# Patient Record
Sex: Male | Born: 1965 | Race: White | Hispanic: No | Marital: Married | Smoking: Current every day smoker
Health system: Southern US, Community
[De-identification: ages and names within clinical notes are randomized; demographics above are authoritative.]

## PROBLEM LIST (undated history)

## (undated) DIAGNOSIS — I2510 Atherosclerotic heart disease of native coronary artery without angina pectoris: Secondary | ICD-10-CM

## (undated) DIAGNOSIS — IMO0002 Reserved for concepts with insufficient information to code with codable children: Secondary | ICD-10-CM

## (undated) DIAGNOSIS — E785 Hyperlipidemia, unspecified: Secondary | ICD-10-CM

## (undated) DIAGNOSIS — I499 Cardiac arrhythmia, unspecified: Secondary | ICD-10-CM

## (undated) DIAGNOSIS — R9430 Abnormal result of cardiovascular function study, unspecified: Secondary | ICD-10-CM

## (undated) DIAGNOSIS — Z720 Tobacco use: Secondary | ICD-10-CM

## (undated) HISTORY — DX: Reserved for concepts with insufficient information to code with codable children: IMO0002

## (undated) HISTORY — DX: Abnormal result of cardiovascular function study, unspecified: R94.30

## (undated) HISTORY — PX: CLAVICLE SURGERY: SHX598

## (undated) HISTORY — DX: Tobacco use: Z72.0

## (undated) HISTORY — PX: FINGER SURGERY: SHX640

## (undated) HISTORY — DX: Cardiac arrhythmia, unspecified: I49.9

---

## 2011-11-24 ENCOUNTER — Encounter (HOSPITAL_COMMUNITY): Payer: Self-pay | Admitting: *Deleted

## 2011-11-24 ENCOUNTER — Other Ambulatory Visit: Payer: Self-pay

## 2011-11-24 ENCOUNTER — Inpatient Hospital Stay (HOSPITAL_COMMUNITY)
Admission: EM | Admit: 2011-11-24 | Discharge: 2011-11-27 | DRG: 853 | Disposition: A | Discharge status: Home / Self Care | Payer: BC Managed Care – PPO | Source: Ambulatory Visit | Attending: Cardiovascular Disease | Admitting: Cardiovascular Disease

## 2011-11-24 ENCOUNTER — Emergency Department (HOSPITAL_COMMUNITY): Payer: BC Managed Care – PPO

## 2011-11-24 DIAGNOSIS — I214 Non-ST elevation (NSTEMI) myocardial infarction: Principal | ICD-10-CM | POA: Diagnosis present

## 2011-11-24 DIAGNOSIS — E785 Hyperlipidemia, unspecified: Secondary | ICD-10-CM | POA: Diagnosis present

## 2011-11-24 DIAGNOSIS — I498 Other specified cardiac arrhythmias: Secondary | ICD-10-CM | POA: Diagnosis present

## 2011-11-24 DIAGNOSIS — I2510 Atherosclerotic heart disease of native coronary artery without angina pectoris: Secondary | ICD-10-CM | POA: Diagnosis present

## 2011-11-24 DIAGNOSIS — F17200 Nicotine dependence, unspecified, uncomplicated: Secondary | ICD-10-CM | POA: Diagnosis present

## 2011-11-24 DIAGNOSIS — R079 Chest pain, unspecified: Secondary | ICD-10-CM

## 2011-11-24 HISTORY — DX: Hyperlipidemia, unspecified: E78.5

## 2011-11-24 HISTORY — DX: Atherosclerotic heart disease of native coronary artery without angina pectoris: I25.10

## 2011-11-24 LAB — CREATININE, SERUM: GFR calc non Af Amer: >90 mL/min (ref >90)

## 2011-11-24 LAB — BASIC METABOLIC PANEL
GFR calc non Af Amer: 56 mL/min — ABNORMAL LOW (ref >90)
Glucose, Bld: 106 mg/dL — ABNORMAL HIGH (ref 70–99)
Potassium: 4 mEq/L (ref 3.5–5.1)
Sodium: 138 mEq/L (ref 135–145)

## 2011-11-24 LAB — CBC
Hemoglobin: 13.9 g/dL (ref 13.0–17.0)
MCH: 31.9 pg (ref 26.0–34.0)
MCHC: 33.8 g/dL (ref 30.0–36.0)
MCHC: 33.8 g/dL (ref 30.0–36.0)
MCV: 94.5 fL (ref 78.0–100.0)
RBC: 4.33 MIL/uL (ref 4.22–5.81)
RBC: 4.2 MIL/uL — ABNORMAL LOW (ref 4.22–5.81)
RDW: 13.5 % (ref 11.5–15.5)
WBC: 10.1 10*3/uL (ref 4.0–10.5)

## 2011-11-24 LAB — CARDIAC PANEL(CRET KIN+CKTOT+MB+TROPI)
CK, MB: 10.9 ng/mL (ref 0.3–4.0)
CK, MB: 81.4 ng/mL (ref 0.3–4.0)
CK, MB: 109.7 ng/mL (ref 0.3–4.0)
Troponin I: 0.67 ng/mL (ref <0.30)
Troponin I: 15.37 ng/mL (ref <0.30)

## 2011-11-24 LAB — DIFFERENTIAL
Basophils Absolute: 0 10*3/uL (ref 0.0–0.1)
Basophils Relative: 0 % (ref 0–1)
Eosinophils Absolute: 0.2 10*3/uL (ref 0.0–0.7)
Eosinophils Relative: 2 % (ref 0–5)
Monocytes Absolute: 0.6 10*3/uL (ref 0.1–1.0)
Monocytes Relative: 6 % (ref 3–12)

## 2011-11-24 LAB — D-DIMER, QUANTITATIVE: D-Dimer, Quant: <0.22 ug/mL-FEU (ref 0.00–0.48)

## 2011-11-24 MED ORDER — ALPRAZOLAM 0.25 MG PO TABS
0.2500 mg | ORAL_TABLET | Freq: Two times a day (BID) | ORAL | Status: DC | PRN
  Administered 2011-11-24: 0.25 mg via ORAL
  Filled 2011-11-24: qty 1

## 2011-11-24 MED ORDER — ALBUTEROL SULFATE HFA 108 (90 BASE) MCG/ACT IN AERS
2.0000 | INHALATION_SPRAY | Freq: Four times a day (QID) | RESPIRATORY_TRACT | Status: DC
  Administered 2011-11-24: 2 via RESPIRATORY_TRACT
  Filled 2011-11-24: qty 6.7

## 2011-11-24 MED ORDER — TICAGRELOR 90 MG PO TABS
90.0000 mg | ORAL_TABLET | Freq: Two times a day (BID) | ORAL | Status: DC
  Administered 2011-11-24 – 2011-11-27 (×6): 90 mg via ORAL
  Filled 2011-11-24 (×8): qty 1

## 2011-11-24 MED ORDER — HEPARIN (PORCINE) IN NACL 100-0.45 UNIT/ML-% IJ SOLN
1000.0000 [IU]/h | INTRAMUSCULAR | Status: DC
  Administered 2011-11-25 – 2011-11-26 (×2): 1000 [IU]/h via INTRAVENOUS
  Filled 2011-11-24 (×6): qty 250

## 2011-11-24 MED ORDER — ACETAMINOPHEN 325 MG PO TABS: 650.0000 mg | ORAL_TABLET | ORAL | Status: DC | PRN

## 2011-11-24 MED ORDER — ONDANSETRON HCL 4 MG/2ML IJ SOLN: 4.0000 mg | Freq: Four times a day (QID) | INTRAMUSCULAR | Status: DC | PRN

## 2011-11-24 MED ORDER — ASPIRIN EC 81 MG PO TBEC
81.0000 mg | DELAYED_RELEASE_TABLET | Freq: Every day | ORAL | Status: DC
  Administered 2011-11-25 – 2011-11-27 (×2): 81 mg via ORAL
  Filled 2011-11-24 (×3): qty 1

## 2011-11-24 MED ORDER — ONDANSETRON HCL 4 MG/2ML IJ SOLN
4.0000 mg | Freq: Once | INTRAMUSCULAR | Status: AC
  Administered 2011-11-24: 4 mg via INTRAVENOUS
  Filled 2011-11-24: qty 2

## 2011-11-24 MED ORDER — METOPROLOL TARTRATE 25 MG PO TABS
25.0000 mg | ORAL_TABLET | Freq: Two times a day (BID) | ORAL | Status: DC
  Administered 2011-11-24 – 2011-11-27 (×6): 25 mg via ORAL
  Filled 2011-11-24 (×8): qty 1

## 2011-11-24 MED ORDER — ROSUVASTATIN CALCIUM 20 MG PO TABS
20.0000 mg | ORAL_TABLET | Freq: Every day | ORAL | Status: DC
  Administered 2011-11-24 – 2011-11-27 (×3): 20 mg via ORAL
  Filled 2011-11-24 (×4): qty 1

## 2011-11-24 MED ORDER — NICOTINE 21 MG/24HR TD PT24
21.0000 mg | MEDICATED_PATCH | Freq: Every day | TRANSDERMAL | Status: DC
  Administered 2011-11-24 – 2011-11-25 (×2): 21 mg via TRANSDERMAL
  Filled 2011-11-24 (×3): qty 1

## 2011-11-24 MED ORDER — HEPARIN BOLUS VIA INFUSION
4000.0000 [IU] | Freq: Once | INTRAVENOUS | Status: AC
  Administered 2011-11-24: 4000 [IU] via INTRAVENOUS
  Filled 2011-11-24: qty 4000

## 2011-11-24 MED ORDER — MORPHINE SULFATE 4 MG/ML IJ SOLN
4.0000 mg | Freq: Once | INTRAMUSCULAR | Status: AC
  Administered 2011-11-24: 4 mg via INTRAVENOUS
  Filled 2011-11-24: qty 1

## 2011-11-24 MED ORDER — NITROGLYCERIN 0.4 MG SL SUBL: 0.4000 mg | SUBLINGUAL_TABLET | SUBLINGUAL | Status: DC | PRN

## 2011-11-24 MED ORDER — HEPARIN SOD (PORCINE) IN D5W 100 UNIT/ML IV SOLN
INTRAVENOUS | Status: AC
  Administered 2011-11-24: 25000 [IU]
  Filled 2011-11-24: qty 250

## 2011-11-24 NOTE — Progress Notes (Signed)
Pt. Had a 10 BRVT. Pt. Was found to be resting and completely asymptomatic. Dr. Elease Hashimoto notified and new orders placed please refer to [facility] and Order Management. Will continue to monitor closely.

## 2011-11-24 NOTE — ED Notes (Addendum)
CRITICAL VALUE ALERT  Critical value received: CK, MB 10.9.  Troponin 0.67  Date of notification:  11/24/2011  Time of notification: 0620  Critical value read back:Yes.  Nurse who received alert:JJohnsonRN  Dr. Dierdre Highman notified.

## 2011-11-24 NOTE — ED Notes (Signed)
Pt reports relief from chest pain.  Resting quietly, respirations regular and unlabored.  Family at bedside.  Pt denies needs at present time.

## 2011-11-24 NOTE — ED Notes (Signed)
Patient is comfortable at this time. Patient states his pain is 0/10 at this moment.

## 2011-11-24 NOTE — ED Notes (Signed)
Pt c/o chest pain that started around 1am tonight. Pt c/o tingling to both arms. Pt had 3 baby aspirin and nitro spray via ems.

## 2011-11-24 NOTE — ED Provider Notes (Signed)
History     CSN: [id number]  Arrival date & time 11/24/11  0454   First MD Initiated Contact with Patient 11/24/11 0503      Chief Complaint  Patient presents with  . Chest Pain    (Consider location/radiation/quality/duration/timing/severity/associated sxs/prior treatment) Patient is a 46 y.o. male presenting with chest pain. The history is provided by the patient.  Chest Pain The chest pain began 3 - 5 hours ago. Duration of episode(s) is 4 hours. Chest pain occurs constantly. The chest pain is improving. Associated with: nothing. At its most intense, the pain is at 6/10. The pain is currently at 1/10. The severity of the pain is moderate. The quality of the pain is described as pressure-like. The pain radiates to the upper back, left jaw, left neck, right arm, left arm, right jaw and right neck. Exacerbated by: nothing. Primary symptoms include shortness of breath. Pertinent negatives for primary symptoms include no fever, no syncope, no cough, no wheezing, no palpitations, no abdominal pain, no nausea, no vomiting, no dizziness and no altered mental status.  Pertinent negatives for associated symptoms include no diaphoresis. Risk factors include male gender and smoking/tobacco exposure.  His past medical history is significant for hypertension.  Pertinent negatives for past medical history include no CAD.  His family medical history is significant for CAD in family.  Procedure history is negative for exercise treadmill test.   pain developed around 1 AM after getting off work. Patient laid down the symptoms persisted. He told his wife about symptoms she called 911. He was given 4 baby aspirin in route and one nitroglycerin and improved his pain from a 6-1. He states he was previously treated for hypertension grandmother medications and never got it refilled. No history of MI or known heart disease but does have a strong family history of same. No history of similar symptoms in the  past.  History reviewed. No pertinent past medical history.  Past Surgical History  Procedure Date  . Clavicle surgery   . Finger surgery     History reviewed. No pertinent family history.  History  Substance Use Topics  . Smoking status: Current Everyday Smoker  . Smokeless tobacco: Not on file  . Alcohol Use: No      Review of Systems  Constitutional: Negative for fever, chills and diaphoresis.  HENT: Negative for neck pain and neck stiffness.   Eyes: Negative for pain.  Respiratory: Positive for shortness of breath. Negative for cough and wheezing.   Cardiovascular: Positive for chest pain. Negative for palpitations and syncope.  Gastrointestinal: Negative for nausea, vomiting and abdominal pain.  Genitourinary: Negative for dysuria.  Musculoskeletal: Negative for back pain.  Skin: Negative for rash.  Neurological: Negative for dizziness and headaches.  Psychiatric/Behavioral: Negative for altered mental status.  All other systems reviewed and are negative.    Allergies  Review of patient's allergies indicates no known allergies.  Home Medications  No current outpatient prescriptions on file.  BP 140/79  Pulse 87  Temp 98 F (36.7 C)  Resp 20  Ht 6' (1.829 m)  Wt 240 lb (108.863 kg)  BMI 32.55 kg/m2  SpO2 97%  Physical Exam  Constitutional: He is oriented to person, place, and time. He appears well-developed and well-nourished.  HENT:  Head: Normocephalic and atraumatic.  Eyes: Conjunctivae and EOM are normal. Pupils are equal, round, and reactive to light.  Neck: Trachea normal. Neck supple. No thyromegaly present.  Cardiovascular: Normal rate, regular rhythm, S1 normal, S2  normal and normal pulses.     No systolic murmur is present   No diastolic murmur is present  Pulses:      Radial pulses are 2+ on the right side, and 2+ on the left side.  Pulmonary/Chest: Effort normal and breath sounds normal. He has no wheezes. He has no rhonchi. He has no  rales. He exhibits no tenderness.  Abdominal: Soft. Normal appearance and bowel sounds are normal. There is no tenderness. There is no CVA tenderness and negative Murphy's sign.  Musculoskeletal:       BLE:s Calves nontender, no cords or erythema, negative Homans sign  Neurological: He is alert and oriented to person, place, and time. He has normal strength. No cranial nerve deficit or sensory deficit. GCS eye subscore is 4. GCS verbal subscore is 5. GCS motor subscore is 6.  Skin: Skin is warm and dry. No rash noted. He is not diaphoretic.  Psychiatric: His speech is normal.       Cooperative and appropriate    ED Course  Procedures (including critical care time)  Results for orders placed during the hospital encounter of 11/24/11  D-DIMER, QUANTITATIVE      Component Value Range   D-Dimer, Quant <0.22  0.00 - 0.48 (ug/mL-FEU)  CBC      Component Value Range   WBC 8.2  4.0 - 10.5 (K/uL)   RBC 4.33  4.22 - 5.81 (MIL/uL)   Hemoglobin 13.9  13.0 - 17.0 (g/dL)   HCT 16.1  09.6 - 04.5 (%)   MCV 94.9  78.0 - 100.0 (fL)   MCH 32.1  26.0 - 34.0 (pg)   MCHC 33.8  30.0 - 36.0 (g/dL)   RDW 40.9  81.1 - 91.4 (%)   Platelets 226  150 - 400 (K/uL)  BASIC METABOLIC PANEL      Component Value Range   Sodium 138  135 - 145 (mEq/L)   Potassium 4.0  3.5 - 5.1 (mEq/L)   Chloride 102  96 - 112 (mEq/L)   CO2 31  19 - 32 (mEq/L)   Glucose, Bld 106 (*) 70 - 99 (mg/dL)   BUN 18  6 - 23 (mg/dL)   Creatinine, Ser 7.82 (*) 0.50 - 1.35 (mg/dL)   Calcium 9.8  8.4 - 95.6 (mg/dL)   GFR calc non Af Amer 56 (*) >90 (mL/min)   GFR calc Af Amer 64 (*) >90 (mL/min)  CARDIAC PANEL(CRET KIN+CKTOT+MB+TROPI)      Component Value Range   Total CK 176  7 - 232 (U/L)   CK, MB 10.9 (*) 0.3 - 4.0 (ng/mL)   Troponin I 0.67 (*) <0.30 (ng/mL)   Relative Index 6.2 (*) 0.0 - 2.5    Dg Chest Portable 1 View  11/24/2011  *RADIOLOGY REPORT*  Clinical Data: Chest pain.  PORTABLE CHEST - 1 VIEW  Comparison: 10/03/2011   Findings: Heart and mediastinal contours are within normal limits. No focal opacities or effusions.  No acute bony abnormality.  IMPRESSION: No active cardiopulmonary disease.  Original Report Authenticated By: Cyndie Chime, M.D.   CRITICAL CARE Performed by: Sunnie Nielsen   Total critical care time: 35  Critical care time was exclusive of separately billable procedures and treating other patients.  Critical care was necessary to treat or prevent imminent or life-threatening deterioration.  Critical care was time spent personally by me on the following activities: development of treatment plan with patient and/or surrogate as well as nursing, discussions with consultants, evaluation of patient's response  to treatment, examination of patient, obtaining history from patient or surrogate, ordering and performing treatments and interventions, ordering and review of laboratory studies, ordering and review of radiographic studies, pulse oximetry and re-evaluation of patient's condition.IV heparin drip for non-ST elevation MI. Serial evaluations remains pain-free after IV morphine. Aspirin provided prior to arrival.   Date: 11/24/2011  Rate:   Rhythm: normal sinus rhythm  QRS Axis: normal  Intervals: normal  ST/T Wave abnormalities: nonspecific ST/T changes  Conduction Disutrbances:none  Narrative Interpretation: inferior and lateral T-wave inversions  Old EKG Reviewed: none available   6:45 AM case discussed as above with Dr. Margo Aye, on call for cardiology unassigned. He accepts patient in transfer to be admitted to [facility] for NSTEMI  MDM   Chest pain concerning for ACS. Aspirin prior to arrival. IV morphine for pain. Patient placed on cardiac monitor. Portal chest x-ray obtained. Labs sent. pain resolved with morphine. Labs reviewed in for elevated troponin, cardiologist consulted. Plan transferred to [facility] for admit step down. Heparin initiated. Patient remains  pain-free.        Sunnie Nielsen, MD 11/24/11 [id number]

## 2011-11-24 NOTE — ED Notes (Signed)
Family at bedside. Patient wanted something to drink. RN Janette approved a cup of water at this time. Patient does not need anything else at this time.

## 2011-11-24 NOTE — Progress Notes (Signed)
CRITICAL VALUE ALERT  Critical value received:  CKMB 81.4 and Troponin 6.77   Date of notification:  11/24/2011  Time of notification:  1420  Critical value read back:yes  Nurse who received alert:  Malachy Chamber, RN   MD notified (1st page):  [place] Lodge, [place]  Time of first page:  1421  MD notified (2nd page):  Time of second page:  Responding MD:  [place]andria Lodge, [place]  Time MD responded:  [phone]

## 2011-11-24 NOTE — ED Notes (Signed)
Pt reprots pain in chest began about 1 am. Reports pain in upper portion of chest and into back.  Denies nausea, vomiting or diaphoresis.  Pt did receive 3 doses of nitro and 324 mg ASA via EMS prior to arrival.  Reporting pain at this time is 3 out of 10.  No distress noted at present.

## 2011-11-24 NOTE — H&P (Signed)
Subjective:   46 yo from [place] transferred from [facility] with NSTEMI.  Jeremy Guzman is a middle-aged woman who really does not go to the doctor much. He has no medical history. He does not take any medications and has no allergies. He works second shift. When he got off from work last night he noticed that his arms were sore. It was like he had worked out at [employer] He didn't start having some neck pain and throat pain and some tightness across his chest. There was some indigestion-like sensation. The pain lasted for several hours and he presented [facility] hospital. He received aspirin, nitroglycerin and the chest pain resolved. He was found to have positive cardiac enzymes and was transferred to [facility] for further evaluation.  He is essentially pain-free at present  Review of systems was reviewed and all systems are negative.     . heparin      . heparin  4,000 Units Intravenous Once  . morphine  4 mg Intravenous Once  . ondansetron  4 mg Intravenous Once      . heparin 1,000 Units/hr (11/24/11 0731)   History   Social History  . Marital Status: Married    Spouse Name: N/A    Number of Children: N/A  . Years of Education: N/A   Occupational History  . Not on file.   Social History Main Topics  . Smoking status: Current Everyday Smoker  . Smokeless tobacco: Not on file  . Alcohol Use: No  . Drug Use: No  . Sexually Active:    Other Topics Concern  . Not on file   Social History Narrative  . No narrative on file    History reviewed. No pertinent family history.  Objective:  Vital Signs in the last 24 hours: Blood pressure 157/59, pulse 63, temperature 98 F (36.7 C), resp. rate 16, height 6' (1.829 m), weight 240 lb (108.863 kg), SpO2 97.00%. Temp:  [98 F (36.7 C)] 98 F (36.7 C) (01/19 0451) Pulse Rate:  [63-87] 63  (01/19 0817) Resp:  [16-20] 16  (01/19 0817) BP: (140-157)/(59-83) 157/59 mmHg (01/19 0817) SpO2:  [94 %-97 %] 97 % (01/19  0817) Weight:  [240 lb (108.863 kg)] 240 lb (108.863 kg) (01/19 0446)  Intake/Output from previous day:   Intake/Output from this shift:    Physical Exam: The patient is alert and oriented x 3.  The mood and affect are normal.   Skin: warm and dry.  Color is normal.    HEENT:   the sclera are nonicteric.  The mucous membranes are moist.  The carotids are 2+ without bruits.  There is no thyromegaly.  There is no JVD.    Lungs: Bilateral wheezing    Heart: regular rate with a normal S1 and S2.  There are no murmurs, gallops, or rubs. The PMI is not displaced.     Abdomen: good bowel sounds.  There is no guarding or rebound.  There is no hepatosplenomegaly or tenderness.  There are no masses.   Extremities:  no clubbing, cyanosis, or edema.  The legs are without rashes.  The distal pulses are intact.   Neuro:  Cranial nerves II - XII are intact.  Motor and sensory functions are intact.     Lab Results:  [facility] 11/24/11 0505  WBC 8.2  HGB 13.9  PLT 226    Basename 11/24/11 0505  NA 138  K 4.0  CL 102  CO2 31  GLUCOSE 106*  BUN 18  CREATININE 1.46*    Basename 11/24/11 0505  TROPONINI 0.67*   No results found for this basename: BNP in the last 72 hours Hepatic Function Panel No results found for this basename: PROT,ALBUMIN,AST,ALT,ALKPHOS,BILITOT,BILIDIR,IBILI in the last 72 hours No results found for this basename: CHOL, HDL, LDLCALC, LDLDIRECT, TRIG, CHOLHDL   No results found for this basename: INR in the last 72 hours  Tele:  NSR  Assessment/Plan:   1. Coronary artery disease:  The patient presents with new-onset chest pain that is consistent with non-ST segment elevation myocardial infarction. His EKG at [facility] hospital revealed significant ST changes in the anterolateral leads. His EKG here at [facility] shows him improved ST segment changes in the anterior lateral leads. He's currently pain-free.  We will put him on the cath schedule for Monday. We  will go ahead and start him on Brilinta since he presents with unstable angina and positive cardiac enzymes.  We talked about heart catheterization. He understands and agrees to proceed. We'll continue with IV heparin for now. We'll start him on IV nitroglycerin if he develops any further episodes of chest pain. We will let him eat today.   Disposition: Cardiac catheterization on Monday.  Vesta Mixer, Montez Hageman., MD, [facility] 11/24/2011, 11:11 AM LOS: Day 0

## 2011-11-25 LAB — LIPID PANEL
Cholesterol: 214 mg/dL — ABNORMAL HIGH (ref 0–200)
HDL: 48 mg/dL (ref >39)
LDL Cholesterol: 136 mg/dL — ABNORMAL HIGH (ref 0–99)
Total CHOL/HDL Ratio: 4.5 RATIO
Triglycerides: 152 mg/dL — ABNORMAL HIGH (ref <150)
VLDL: 30 mg/dL (ref 0–40)

## 2011-11-25 MED ORDER — ASPIRIN 81 MG PO CHEW
324.0000 mg | CHEWABLE_TABLET | ORAL | Status: AC
  Administered 2011-11-26: 324 mg via ORAL
  Filled 2011-11-25: qty 4

## 2011-11-25 MED ORDER — DIAZEPAM 5 MG PO TABS
5.0000 mg | ORAL_TABLET | ORAL | Status: AC
  Administered 2011-11-26: 5 mg via ORAL
  Filled 2011-11-25: qty 1

## 2011-11-25 MED ORDER — SODIUM CHLORIDE 0.9 % IJ SOLN
3.0000 mL | Freq: Two times a day (BID) | INTRAMUSCULAR | Status: DC
  Administered 2011-11-25 – 2011-11-26 (×3): 3 mL via INTRAVENOUS

## 2011-11-25 MED ORDER — SODIUM CHLORIDE 0.9 % IV SOLN: 250.0000 mL | INTRAVENOUS | Status: DC | PRN

## 2011-11-25 MED ORDER — SODIUM CHLORIDE 0.9 % IV SOLN
1.0000 mL/kg/h | INTRAVENOUS | Status: DC
  Administered 2011-11-26: 1 mL/kg/h via INTRAVENOUS

## 2011-11-25 MED ORDER — SODIUM CHLORIDE 0.9 % IJ SOLN: 3.0000 mL | INTRAMUSCULAR | Status: DC | PRN

## 2011-11-25 MED ORDER — ALBUTEROL SULFATE HFA 108 (90 BASE) MCG/ACT IN AERS
2.0000 | INHALATION_SPRAY | RESPIRATORY_TRACT | Status: DC | PRN
  Administered 2011-11-25: 2 via RESPIRATORY_TRACT

## 2011-11-25 NOTE — Progress Notes (Signed)
  Subjective:   Pt was admitted with ACS / NSTEMI.  He is pain free.  ECG has improved.  He was started on Brilinta.      . aspirin  324 mg Oral Pre-Cath  . aspirin EC  81 mg Oral Daily  . diazepam  5 mg Oral On Call  . metoprolol tartrate  25 mg Oral BID  . nicotine  21 mg Transdermal Daily  . rosuvastatin  20 mg Oral Daily  . sodium chloride  3 mL Intravenous Q12H  . Ticagrelor  90 mg Oral BID  . DISCONTD: albuterol  2 puff Inhalation Q6H      . sodium chloride    . heparin 10 mL/hr (11/25/11 0600)    Objective:  Vital Signs in the last 24 hours: Blood pressure 131/66, pulse 62, temperature 98.5 F (36.9 C), temperature source Oral, resp. rate 20, height 6' (1.829 m), weight 241 lb (109.317 kg), SpO2 96.00%. Temp:  [97.4 F (36.3 C)-99 F (37.2 C)] 98.5 F (36.9 C) (01/20 0744) Pulse Rate:  [59-74] 62  (01/20 0400) Resp:  [9-21] 20  (01/20 0400) BP: (130-149)/(63-86) 131/66 mmHg (01/20 0400) SpO2:  [0 %-100 %] 96 % (01/20 0400) FiO2 (%):  [100 %] 100 % (01/19 1122) Weight:  [241 lb (109.317 kg)] 241 lb (109.317 kg) (01/19 1300)  Intake/Output from previous day: 01/19 0701 - 01/20 0700 In: 720 [P.O.:720] Out: 3150 [Urine:3150] Intake/Output from this shift: Total I/O In: -  Out: 450 [Urine:450]  Physical Exam: The patient is alert and oriented x 3.  The mood and affect are normal.   Skin: warm and dry.  Color is normal.    HEENT:   the sclera are nonicteric.  The mucous membranes are moist.  The carotids are 2+ without bruits.  There is no thyromegaly.  There is no JVD.    Lungs: clear.  The chest wall is non tender.    Heart: regular rate with a normal S1 and S2.  There are no murmurs, gallops, or rubs. The PMI is not displaced.     Abdomen: good bowel sounds.  There is no guarding or rebound.  There is no hepatosplenomegaly or tenderness.  There are no masses.   Extremities:  no clubbing, cyanosis, or edema.  The legs are without rashes.  The distal  pulses are intact.   Neuro:  Cranial nerves II - XII are intact.  Motor and sensory functions are intact.     Lab Results:  Basename 11/24/11 1211 11/24/11 0505  WBC 10.1 8.2  HGB 13.4 13.9  PLT 201 226    Basename 11/24/11 1211 11/24/11 0505  NA -- 138  K -- 4.0  CL -- 102  CO2 -- 31  GLUCOSE -- 106*  BUN -- 18  CREATININE 0.82 1.46*    Basename 11/24/11 1809 11/24/11 1211  TROPONINI 15.37* 6.77*   No results found for this basename: BNP in the last 72 hours Hepatic Function Panel No results found for this basename: PROT,ALBUMIN,AST,ALT,ALKPHOS,BILITOT,BILIDIR,IBILI in the last 72 hours Lab Results  Component Value Date   CHOL 214* 11/25/2011   HDL 48 11/25/2011   LDLCALC 136* 11/25/2011   TRIG 152* 11/25/2011   CHOLHDL 4.5 11/25/2011   No results found for this basename: INR in the last 72 hours  Tele:  NSR.  He had a 10 beat run of VT yesterday.  ECG : NSR, no ST or T wave changes  Assessment/Plan:   1.  Acute   Coronary Syndrome:  NSTEMI:  Better on heparin and Brilinta.  For cath tomorrow   Disposition: cath tomorrow.   Philip J. Nahser, Jr., MD, FACC 11/25/2011, 9:24 AM LOS: Day 1      

## 2011-11-25 NOTE — Plan of Care (Signed)
Problem: Consults Goal: Cardiac Cath Patient Education (See Patient Education module for education specifics.) Outcome: Completed/Met Date Met:  11/25/11 Pt. Viewed CCTV

## 2011-11-26 ENCOUNTER — Encounter (HOSPITAL_COMMUNITY): Payer: Self-pay | Admitting: Cardiology

## 2011-11-26 ENCOUNTER — Other Ambulatory Visit: Payer: Self-pay

## 2011-11-26 ENCOUNTER — Encounter (HOSPITAL_COMMUNITY)
Admission: EM | Disposition: A | Discharge status: Home / Self Care | Payer: Self-pay | Source: Ambulatory Visit | Attending: Internal Medicine

## 2011-11-26 DIAGNOSIS — I2510 Atherosclerotic heart disease of native coronary artery without angina pectoris: Secondary | ICD-10-CM

## 2011-11-26 DIAGNOSIS — I214 Non-ST elevation (NSTEMI) myocardial infarction: Secondary | ICD-10-CM

## 2011-11-26 HISTORY — PX: LEFT HEART CATHETERIZATION WITH CORONARY ANGIOGRAM: SHX5451

## 2011-11-26 LAB — BASIC METABOLIC PANEL
BUN: 15 mg/dL (ref 6–23)
GFR calc Af Amer: >90 mL/min (ref >90)
GFR calc non Af Amer: >90 mL/min (ref >90)
Potassium: 4.1 mEq/L (ref 3.5–5.1)
Sodium: 139 mEq/L (ref 135–145)

## 2011-11-26 LAB — CBC
MCH: 32.3 pg (ref 26.0–34.0)
Platelets: 205 10*3/uL (ref 150–400)
RBC: 4.49 MIL/uL (ref 4.22–5.81)
WBC: 9.1 10*3/uL (ref 4.0–10.5)

## 2011-11-26 LAB — POCT ACTIVATED CLOTTING TIME: Activated Clotting Time: 595 seconds

## 2011-11-26 LAB — PROTIME-INR: Prothrombin Time: 12.8 seconds (ref 11.6–15.2)

## 2011-11-26 SURGERY — LEFT HEART CATHETERIZATION WITH CORONARY ANGIOGRAM
Anesthesia: LOCAL

## 2011-11-26 MED ORDER — VERAPAMIL HCL 2.5 MG/ML IV SOLN
INTRAVENOUS | Status: AC
  Filled 2011-11-26: qty 2

## 2011-11-26 MED ORDER — TICAGRELOR 90 MG PO TABS
ORAL_TABLET | ORAL | Status: AC
  Filled 2011-11-26: qty 1

## 2011-11-26 MED ORDER — MIDAZOLAM HCL 2 MG/2ML IJ SOLN
INTRAMUSCULAR | Status: AC
  Filled 2011-11-26: qty 2

## 2011-11-26 MED ORDER — HEPARIN SODIUM (PORCINE) 1000 UNIT/ML IJ SOLN
INTRAMUSCULAR | Status: AC
  Filled 2011-11-26: qty 1

## 2011-11-26 MED ORDER — ACETAMINOPHEN 325 MG PO TABS: 650.0000 mg | ORAL_TABLET | ORAL | Status: DC | PRN

## 2011-11-26 MED ORDER — FENTANYL CITRATE 0.05 MG/ML IJ SOLN
INTRAMUSCULAR | Status: AC
  Filled 2011-11-26: qty 2

## 2011-11-26 MED ORDER — NICOTINE 21 MG/24HR TD PT24
21.0000 mg | MEDICATED_PATCH | Freq: Every day | TRANSDERMAL | Status: DC
  Administered 2011-11-26 – 2011-11-27 (×2): 21 mg via TRANSDERMAL
  Filled 2011-11-26 (×2): qty 1

## 2011-11-26 MED ORDER — HEPARIN (PORCINE) IN NACL 2-0.9 UNIT/ML-% IJ SOLN
INTRAMUSCULAR | Status: AC
  Filled 2011-11-26: qty 2000

## 2011-11-26 MED ORDER — LIDOCAINE HCL (PF) 1 % IJ SOLN
INTRAMUSCULAR | Status: AC
  Filled 2011-11-26: qty 30

## 2011-11-26 MED ORDER — NITROGLYCERIN 0.2 MG/ML ON CALL CATH LAB
INTRAVENOUS | Status: AC
  Filled 2011-11-26: qty 1

## 2011-11-26 MED ORDER — BIVALIRUDIN 250 MG IV SOLR
INTRAVENOUS | Status: AC
  Filled 2011-11-26: qty 250

## 2011-11-26 MED ORDER — ACTIVE PARTNERSHIP FOR HEALTH OF YOUR HEART BOOK
Freq: Once | Status: DC
  Filled 2011-11-26: qty 1

## 2011-11-26 MED ORDER — HEPARIN (PORCINE) IN NACL 2-0.9 UNIT/ML-% IJ SOLN
INTRAMUSCULAR | Status: AC
  Filled 2011-11-26: qty 1000

## 2011-11-26 MED ORDER — SODIUM CHLORIDE 0.9 % IV SOLN
INTRAVENOUS | Status: AC
  Administered 2011-11-26: 13:00:00 via INTRAVENOUS

## 2011-11-26 NOTE — Op Note (Signed)
Cardiac Catheterization Procedure Note  Name: Jeremy Guzman MRN: [id number] DOB: 15-Dec-1965  Procedure: Left Heart Cath, Selective Coronary Angiography, LV angiography, PTCA and stenting of the CFX with DES  Indication:  This gentleman presented with non STEMI.  ECG was normal.  Enzymes were moderately elevated.  He is a smoker and is currently pain free.    Procedural Details:  The left wrist was prepped, draped, and anesthetized with 1% lidocaine. Using the modified Seldinger technique, a 5 French sheath was introduced into the left radial artery. 3 mg of verapamil was administered through the sheath, weight-based unfractionated heparin was administered intravenously. Standard Judkins catheters were used for selective coronary angiography and left ventriculography. Catheter exchanges were performed over an exchange length guidewire.  PROCEDURAL FINDINGS Hemodynamics: AO 125/83 (104) LV 134/12 No gradient on pullback across the aortic valve.     Coronary angiography: Coronary dominance: right  Left mainstem: No significant obstruction  Left anterior descending (LAD): Courses to the apex.  There is 40% segmental plaque proximally.  Distally there is mild luminal irregularity.  No critical obstruction.  Ramus:  There is a moderate sized ramus branch without obstruction.  Left circumflex (LCx): The circumflex is a large caliber vessel with subtotal  (90% or greater) occlusion at the site of the OM2.  There is mild irregularity at the bifurcation distally of the OM3.  The CFX appears to be the infarct related artery.  There is segmental plaque of 50% prior to the subtotal occlusion.  Right coronary artery (RCA): There is mild irregularity, and a 50% distal stenosis prior to the PDA takeoff.  Left ventriculography: Left ventricular systolic function is normal, LVEF is estimated at 55%, there is no significant mitral regurgitation   PCI Note:  Following the diagnostic procedure, the  decision was made to proceed with PCI. The radial sheath was upsized to a 6 [place]. Weight-based bivalirudin was given for anticoagulation. Once a therapeutic ACT was achieved, a 6 [place] JL 3.0 guide catheter was inserted.  A Traverse coronary guidewire was used to cross the lesion.  The lesion was predilated with a 2.58mm balloon.  The lesion was then stented with a 2.75 by 16 mm Promus Element stent.   Because of significant disease proximal to the stent site, a second Promus Element 3.0 by 12mm stent was placed in overlapping fashion..  The stent was postdilated with a 2.75 and then 3.0 mm  noncompliant balloon.  Following PCI, there was 0% residual stenosis and TIMI-3 flow. Final angiography confirmed an excellent result. There was some pinching of the OM2, but this came off at a 90 degree angle, and flow was good with about 50% pinching.  The patient tolerated the procedure well. There were no immediate procedural complications. A TR band was used for radial hemostasis. The patient was transferred to the post catheterization recovery area for further monitoring.  PCI Data: Vessel - CFX/Segment - 19 Percent Stenosis (pre)  99% TIMI-flow 3 Stent Promus Element times 2 (2.75 by 16, 3.0 by 12; overlapping Percent Stenosis (post) 0% TIMI-flow (post) 3  Final Conclusions:   1.  Non STEMI with subtotal occlusion of the CFX with continued patency 2.  Successful PCI using overlapping DES stents 3.  Moderate residual plaque   Recommendations:  1.  Stop smoking 2.  Dual antiplatelet therapy for one year minimum, and after at the discretion of the cardiologist of record.  Likely one year with ASA indefinitely. 3.  Recommend cardiac rehab. 4.  Probably return  to work at [employer] in 3-4 weeks. 5.  Follow up with Dr. Elease Hashimoto.    Shawnie Pons 11/26/2011, 10:14 AM

## 2011-11-26 NOTE — Interval H&P Note (Signed)
History and Physical Interval Note:  11/26/2011 7:54 AM  Jeremy Guzman  has presented today for surgery, with the diagnosis of cp  The various methods of treatment have been discussed with the patient.  I reviewed his data, and talked with him regarding his presenting symptoms.  Enzymes are moderately elevated, and the electrocardiogram is normal.  CXR is unremarkable.   He has no prior history.  We reviewed possible entry sites as well, and he would prefer a radial approach.  Given the IV locations at present, we will approach from the left radial artery.  Left Allen's is present (normal). After consideration of risks, benefits and other options for treatment, the patient has consented to  Procedure(s): LEFT HEART CATHETERIZATION WITH CORONARY ANGIOGRAM and possible PCI as a surgical intervention .  The patients' history has been reviewed, patient examined, no change in status, stable for surgery.  I have reviewed the patients' chart and labs.  Questions were answered to the patient's satisfaction.   He consents to further evaluation after our recommendations.     Shawnie Pons, MD, [facility], [facility]

## 2011-11-26 NOTE — H&P (View-Only) (Signed)
Subjective:   Pt was admitted with ACS / NSTEMI.  He is pain free.  ECG has improved.  He was started on Brilinta.      Marland Kitchen aspirin  324 mg Oral Pre-Cath  . aspirin EC  81 mg Oral Daily  . diazepam  5 mg Oral On Call  . metoprolol tartrate  25 mg Oral BID  . nicotine  21 mg Transdermal Daily  . rosuvastatin  20 mg Oral Daily  . sodium chloride  3 mL Intravenous Q12H  . Ticagrelor  90 mg Oral BID  . DISCONTD: albuterol  2 puff Inhalation Q6H      . sodium chloride    . heparin 10 mL/hr (11/25/11 0600)    Objective:  Vital Signs in the last 24 hours: Blood pressure 131/66, pulse 62, temperature 98.5 F (36.9 C), temperature source Oral, resp. rate 20, height 6' (1.829 m), weight 241 lb (109.317 kg), SpO2 96.00%. Temp:  [97.4 F (36.3 C)-99 F (37.2 C)] 98.5 F (36.9 C) (01/20 0744) Pulse Rate:  [59-74] 62  (01/20 0400) Resp:  [9-21] 20  (01/20 0400) BP: (130-149)/(63-86) 131/66 mmHg (01/20 0400) SpO2:  [0 %-100 %] 96 % (01/20 0400) FiO2 (%):  [100 %] 100 % (01/19 1122) Weight:  [241 lb (109.317 kg)] 241 lb (109.317 kg) (01/19 1300)  Intake/Output from previous day: 01/19 0701 - 01/20 0700 In: 720 [P.O.:720] Out: 3150 [Urine:3150] Intake/Output from this shift: Total I/O In: -  Out: 450 [Urine:450]  Physical Exam: The patient is alert and oriented x 3.  The mood and affect are normal.   Skin: warm and dry.  Color is normal.    HEENT:   the sclera are nonicteric.  The mucous membranes are moist.  The carotids are 2+ without bruits.  There is no thyromegaly.  There is no JVD.    Lungs: clear.  The chest wall is non tender.    Heart: regular rate with a normal S1 and S2.  There are no murmurs, gallops, or rubs. The PMI is not displaced.     Abdomen: good bowel sounds.  There is no guarding or rebound.  There is no hepatosplenomegaly or tenderness.  There are no masses.   Extremities:  no clubbing, cyanosis, or edema.  The legs are without rashes.  The distal  pulses are intact.   Neuro:  Cranial nerves II - XII are intact.  Motor and sensory functions are intact.     Lab Results:  Basename 11/24/11 1211 11/24/11 0505  WBC 10.1 8.2  HGB 13.4 13.9  PLT 201 226    Basename 11/24/11 1211 11/24/11 0505  NA -- 138  K -- 4.0  CL -- 102  CO2 -- 31  GLUCOSE -- 106*  BUN -- 18  CREATININE 0.82 1.46*    Basename 11/24/11 1809 11/24/11 1211  TROPONINI 15.37* 6.77*   No results found for this basename: BNP in the last 72 hours Hepatic Function Panel No results found for this basename: PROT,ALBUMIN,AST,ALT,ALKPHOS,BILITOT,BILIDIR,IBILI in the last 72 hours Lab Results  Component Value Date   CHOL 214* 11/25/2011   HDL 48 11/25/2011   LDLCALC 136* 11/25/2011   TRIG 152* 11/25/2011   CHOLHDL 4.5 11/25/2011   No results found for this basename: INR in the last 72 hours  Tele:  NSR.  He had a 10 beat run of VT yesterday.  ECG : NSR, no ST or T wave changes  Assessment/Plan:   1.  Acute  Coronary Syndrome:  NSTEMI:  Better on heparin and Brilinta.  For cath tomorrow   Disposition: cath tomorrow.   Vesta Mixer, Montez Hageman., MD, [facility] 11/25/2011, 9:24 AM LOS: Day 1

## 2011-11-26 NOTE — Consult Note (Signed)
Pt smokes 1 ppd and is in preparation stage for quitting. Advised and encouraged pt to quit. Pt voices understanding of risk factors of smoking. Recommended 21 mg patch x 6 weeks, 14 mg patch x 2 weeks and 7 mg patch x 2 weeks. Discussed patch use instructions. Referred to 1-800 quit now for f/u and support. Discussed oral fixation substitutes, second hand smoke and in home smoking policy. Reviewed and gave pt Written education/contact information.

## 2011-11-26 NOTE — Progress Notes (Signed)
Stable following PCI from the left radial approach.  Overlapping stents.  Wrist looks good.  No chest pain.  VS are stable.  Will reassess in the am.    Patient aware of need for change in lifestyle.  Smoking discussed with him now twice.  His wife smokes, and this will be difficult transition for them.  Patient acknowledges understanding.   Shawnie Pons 1:03 PM 11/26/2011

## 2011-11-27 ENCOUNTER — Encounter (HOSPITAL_COMMUNITY): Payer: Self-pay | Admitting: Physician Assistant

## 2011-11-27 ENCOUNTER — Encounter: Payer: Self-pay | Admitting: *Deleted

## 2011-11-27 ENCOUNTER — Other Ambulatory Visit: Payer: Self-pay | Admitting: *Deleted

## 2011-11-27 DIAGNOSIS — E785 Hyperlipidemia, unspecified: Secondary | ICD-10-CM

## 2011-11-27 DIAGNOSIS — Z79899 Other long term (current) drug therapy: Secondary | ICD-10-CM

## 2011-11-27 LAB — CARDIAC PANEL(CRET KIN+CKTOT+MB+TROPI)
CK, MB: 6.3 ng/mL (ref 0.3–4.0)
Relative Index: INVALID (ref 0.0–2.5)
Total CK: 90 U/L (ref 7–232)

## 2011-11-27 LAB — BASIC METABOLIC PANEL
BUN: 14 mg/dL (ref 6–23)
CO2: 24 mEq/L (ref 19–32)
Calcium: 9.7 mg/dL (ref 8.4–10.5)
Chloride: 103 mEq/L (ref 96–112)
Creatinine, Ser: 0.74 mg/dL (ref 0.50–1.35)
GFR calc Af Amer: >90 mL/min (ref >90)
GFR calc non Af Amer: >90 mL/min (ref >90)
Glucose, Bld: 97 mg/dL (ref 70–99)
Potassium: 4.1 mEq/L (ref 3.5–5.1)
Sodium: 136 mEq/L (ref 135–145)

## 2011-11-27 LAB — CBC
HCT: 43.3 % (ref 39.0–52.0)
Hemoglobin: 14.5 g/dL (ref 13.0–17.0)
MCH: 31.7 pg (ref 26.0–34.0)
MCHC: 33.5 g/dL (ref 30.0–36.0)
MCV: 94.7 fL (ref 78.0–100.0)
Platelets: 209 10*3/uL (ref 150–400)
RBC: 4.57 MIL/uL (ref 4.22–5.81)
RDW: 13.5 % (ref 11.5–15.5)
WBC: 9.6 10*3/uL (ref 4.0–10.5)

## 2011-11-27 LAB — HEPATIC FUNCTION PANEL
Alkaline Phosphatase: 56 U/L (ref 39–117)
Total Protein: 6.8 g/dL (ref 6.0–8.3)

## 2011-11-27 MED ORDER — TICAGRELOR 90 MG PO TABS: 90.0000 mg | ORAL_TABLET | Freq: Two times a day (BID) | ORAL | Status: DC

## 2011-11-27 MED ORDER — ASPIRIN 81 MG PO TBEC: 81.0000 mg | DELAYED_RELEASE_TABLET | Freq: Every day | ORAL | Status: AC

## 2011-11-27 MED ORDER — METOPROLOL TARTRATE 25 MG PO TABS: 25.0000 mg | ORAL_TABLET | Freq: Two times a day (BID) | ORAL | Status: DC

## 2011-11-27 MED ORDER — NITROGLYCERIN 0.4 MG SL SUBL: 0.4000 mg | SUBLINGUAL_TABLET | SUBLINGUAL | Status: DC | PRN

## 2011-11-27 MED ORDER — ATORVASTATIN CALCIUM 40 MG PO TABS: 40.0000 mg | ORAL_TABLET | Freq: Every day | ORAL | Status: DC

## 2011-11-27 MED FILL — Dextrose Inj 5%: INTRAVENOUS | Qty: 50 | Status: AC

## 2011-11-27 NOTE — Progress Notes (Signed)
Nursing Note:  Pt. Discharged to home with wife.  Ambulated around department w/o difficulty or chestpain.  PIVs d/c'd and pressure dressings applied to site.  D/C instructions given with all questions and concerns addressed.  Pt. Awake, alert and oriented upon discharge.  Pt. Chose to ambulate to vehicle.

## 2011-11-27 NOTE — Progress Notes (Signed)
11/27/2011 0515 RT has requested written order for pt to use home CPAP QHS from MD twice. RN received verbal order, but need written order on file. RT will continue to monitor.

## 2011-11-27 NOTE — Progress Notes (Signed)
CARDIAC REHAB PHASE I   PRE:  Rate/Rhythm: 72 Sr    BP: sitting 127/68    SaO2:   MODE:  Ambulation: 700  ft   POST:  Rate/Rhythm: 74 SR    BP: sitting 116/70 (prob inaccurate)     SaO2:   Tolerated well, no problems. Feels good. Ed completed with pt and wife. Voiced understanding but does not seem very interested in smoking cessation or ex. Agreed for his referral be sent to [facility].  [phone]  Harriet Masson CES, ACSM

## 2011-11-27 NOTE — Discharge Summary (Signed)
Discharge Summary   Patient ID: Jeremy Guzman MRN: [id number], DOB/AGE: 48-Nov-1967 48 y.o. Admit date: 11/24/2011 D/C date:     11/27/2011   Primary Discharge Diagnoses:   1. Newly diagnosed CAD - with NSTEMI s/p PTCA/overlapping DES to Cx 11/26/11 - residual mod CAD for med rx in LAD, LCx, RCA - normal LV function with EF 55% by cath 11/26/11   2. Dyslipidemia with TChol 214, Trig 152, HDL 48, LDL 136  - initiated on Lipitor this admission, f/u LFTs/Lipids 6 weeks   3. NSVT, none since revascularization   Hospital Course: 46 y/o M with no prior cardiac history or noted significant PMH who presented initially to [facility] complaining of CP and arm soreness as well as throat pain. His CP was indigestion-like. At [facility], he was found to have positive cardiac enzymes and received aspirin, nitroglycerin with resolution of pain. He was transferred to [facility] for further evaluation. He was placed on IV heparin. Enzymes trended up with a peak troponin of 15.37. Prior to cath he had 4-10 beats of NSVT and was continued on BB therapy. He remained chest pain free over the weekend and went for cath 11/26/11 demonstrating subtotal occlusion of Cx with was stented with Promus Element overlapping drug-eluting stents. He had moderate residual dz in LAD, LCx, RCA. EF was 55%. The patient tolerated the procedure well. He had no further NSVT or any arrhythmias on telemetry. He was counseled regarding tobacco use. He was started on a statin for dyslipidemia. Dr. Riley Kill initially recommended the patient stay another day but ultimately Jeremy Guzman is insistent on being discharged.  Today the patient is feeling well. Enzymes have trended down. He ambulated without difficulty and received education from cardiac rehab. Dr. Riley Kill has seen and examined him today and feels he is stable for discharge. He prefers to f/u in [place] which is close to his work. Of note, he was given handwritten 30 day RX with zero  refills to fill locally and 90-day RX to send off to [facility] mail order at his request.  Discharge Vitals: Blood pressure 122/68, pulse 61, temperature 97.6 F (36.4 C), temperature source Oral, resp. rate 17, height 6' (1.829 m), weight 231 lb 14.8 oz (105.2 kg), SpO2 97.00%.  Labs: Lab Results  Component Value Date   WBC 9.6 11/27/2011   HGB 14.5 11/27/2011   HCT 43.3 11/27/2011   MCV 94.7 11/27/2011   PLT 209 11/27/2011    Lab 11/27/11 0445  NA 136  K 4.1  CL 103  CO2 24  BUN 14  CREATININE 0.74  CALCIUM 9.7  PROT 6.8  BILITOT 0.5  ALKPHOS 56  ALT 32  AST 30  GLUCOSE 97    Basename 11/27/11 0949 11/24/11 1809 11/24/11 1211  CKTOTAL 90 749* 513*  CKMB 6.3* 109.7* 81.4*  TROPONINI 2.58* 15.37* 6.77*   Lab Results  Component Value Date   CHOL 214* 11/25/2011   HDL 48 11/25/2011   LDLCALC 136* 11/25/2011   TRIG 152* 11/25/2011   Lab Results  Component Value Date   DDIMER <0.22 11/24/2011    Diagnostic Studies/Procedures   1. Chest Portable 1 View 11/24/2011  *RADIOLOGY REPORT*  Clinical Data: Chest pain.  PORTABLE CHEST - 1 VIEW  Comparison: 10/03/2011  Findings: Heart and mediastinal contours are within normal limits. No focal opacities or effusions.  No acute bony abnormality.  IMPRESSION: No active cardiopulmonary disease.  Original Report Authenticated By: Cyndie Chime, M.D.   2.  Cardiac catheterization this admission, please see full report and above for summary.  Discharge Medications   Medication List  As of 11/27/2011 11:59 AM   TAKE these medications         aspirin 81 MG EC tablet   Take 1 tablet (81 mg total) by mouth daily.      atorvastatin 40 MG tablet   Commonly known as: LIPITOR   Take 1 tablet (40 mg total) by mouth at bedtime.      metoprolol tartrate 25 MG tablet   Commonly known as: LOPRESSOR   Take 1 tablet (25 mg total) by mouth 2 (two) times daily.      nitroGLYCERIN 0.4 MG SL tablet   Commonly known as: NITROSTAT   Place 1 tablet  (0.4 mg total) under the tongue every 5 (five) minutes as needed for chest pain (Up to 3 doses).      Ticagrelor 90 MG Tabs tablet   Commonly known as: BRILINTA   Take 1 tablet (90 mg total) by mouth 2 (two) times daily.           Disposition   The patient will be discharged in stable condition to home. Discharge Orders    Future Orders Please Complete By Expires   Diet - low sodium heart healthy      Increase activity slowly      Comments:   No driving for 2 weeks - you may only return to driving at that time if you have felt well. No heavy lifting for 4 weeks. No sexual activity for 2 weeks. You may not return to work until cleared by your cardiologist. Keep procedure site clean & dry. If you notice increased pain, swelling, bleeding or pus, call/return!  You may shower, but no soaking baths/hot tubs/pools for 1 week.       Follow-up Information    Follow up with [facility]. (The [facility] office will call you with an appointment. You will also need repeat labwork in 6 weeks to check cholesterol/liver function tests since we started a cholesterol medicine. Our office will help arrange this.)    Contact information:   [address] [place] [address] [phone]           Duration of Discharge Encounter: Greater than 30 minutes including physician and PA time.  Signed, Kriste Basque Lamond Glantz PA-C 11/27/2011, 11:59 AM

## 2011-11-27 NOTE — Progress Notes (Signed)
   CARE MANAGEMENT NOTE 11/27/2011  Patient:  Jeremy Guzman, Jeremy Guzman   Account Number:  [id number]  Date Initiated:  11/27/2011  Documentation initiated by:  GRAVES-BIGELOW,Mubashir Mallek  Subjective/Objective Assessment:   Pt from [facility] transferred from [facility] with NSTEMI. Pt is form home with family. He will  be d/c on brilinta and Cm called [facility] in [place]-not available. [facility] and     Action/Plan:   Pt was given brilinta card. He will need 30 day rx no refills and then he will need a 90 day rx due to he uses mail order. With all other meds he will also need a 30 day supply rx to get locally and then a 90 day rx to [facility]   Anticipated DC Date:  11/27/2011   Anticipated DC Plan:  HOME/SELF CARE      DC Planning Services  CM consult      Choice offered to / List presented to:             Status of service:  Completed, signed off Medicare Important Message given?   (If response is "NO", the following Medicare IM given date fields will be blank) Date Medicare IM given:   Date Additional Medicare IM given:    Discharge Disposition:  HOME/SELF CARE  Per UR Regulation:    Comments:  11-27-11 [id number] Tomi Bamberger, RN,BSN [phone] [facility] called [facility] and they will order medication- pt will pick up in am. RN will give pt evening brilinta to take home for tonight. Benefits check in process.

## 2011-11-27 NOTE — Progress Notes (Signed)
Patient Name: Jeremy Guzman Date of Encounter: 11/27/2011, 6:45 AM    Subjective  Feels great. Slept well. No CP/SOB.   Objective   Telemetry: NSR without arrhythmias. No NSVT since 1/20 Physical Exam: Filed Vitals:   11/27/11 0411  BP: 127/62  Pulse:   Temp: 98 F (36.7 C)  Resp: 17   General: Well developed, well nourished, in no acute distress laying flat in bed, was wearing CPAP when I walked in Head: Normocephalic, atraumatic, sclera non-icteric, no xanthomas, nares are without discharge.  Neck: Negative for carotid bruits. JVD not elevated. Lungs: Clear bilaterally to auscultation without wheezes, rales, or rhonchi. Breathing is unlabored. Heart: RRR S1 S2 without murmurs, rubs, or gallops.  Abdomen: Soft, non-tender, non-distended with normoactive bowel sounds. No hepatomegaly. No rebound/guarding. No obvious abdominal masses. Msk:  Strength and tone appear normal for age. Extremities: No clubbing or cyanosis. No edema. Wrist site ok. Distal pedal pulses are 2+ and equal bilaterally. Neuro: Alert and oriented X 3. Moves all extremities spontaneously. Psych:  Responds to questions appropriately with a normal affect.    Intake/Output Summary (Last 24 hours) at 11/27/11 0645 Last data filed at 11/26/11 2200  Gross per 24 hour  Intake 1507.4 ml  Output   1975 ml  Net -467.6 ml    Labs:  Basename 11/26/11 0430 11/24/11 1211  NA 139 --  K 4.1 --  CL 104 --  CO2 28 --  GLUCOSE 101* --  BUN 15 --  CREATININE 0.83 0.82  CALCIUM 9.8 --  MG -- 2.4  PHOS -- --    Basename 11/27/11 0445 11/26/11 0430 11/24/11 1211  WBC 9.6 9.1 --  NEUTROABS -- -- 6.3  HGB 14.5 14.5 --  HCT 43.3 42.4 --  MCV 94.7 94.4 --  PLT 209 205 --    Basename 11/24/11 1809 11/24/11 1211  CKTOTAL 749* 513*  CKMB 109.7* 81.4*  TROPONINI 15.37* 6.77*    Basename 11/25/11 0520  CHOL 214*  HDL 48  LDLCALC 136*  TRIG 152*  CHOLHDL 4.5    Basename 11/24/11 1211  TSH 0.807    Radiology/Studies:  1. Chest Portable 1 View 11/24/2011  *RADIOLOGY REPORT*  Clinical Data: Chest pain.  PORTABLE CHEST - 1 VIEW  Comparison: 10/03/2011  Findings: Heart and mediastinal contours are within normal limits. No focal opacities or effusions.  No acute bony abnormality.  IMPRESSION: No active cardiopulmonary disease.  Original Report Authenticated By: Cyndie Chime, M.D.   2. Cardiac catheterization this admission, please see full report and above for summary.    Assessment and Plan   1. Newly diagnosed CAD with NSTEMI s/p PTCA/overlapping DES to Cx with residual mild-mod CAD for med rx & normal LV function with EF 55% by cath 11/26/11 - continue ASA, statin, BB, Brilinta - will ask CM to assist with ensuring Brilinta is available at patient's pharmacy - add LFT's onto morning labs for baseline given initiation of statin this admission  2. Dyslipidemia with TChol 214, Trig 152, HDL 48, LDL 136 - started on Crestor - change to Lipitor at discharge to help with cost - f/u LFTs/Lipids 6 weeks  3. Sleep apnea  - continue CPAP  4. NSVT 11/25/11, resolved - continue BB at discharge  Cardiac rehab plans to see patient early this morning - will see how he feels with ambulation. ?D/C today.  Signed, Ronie Spies PA-C  Patient stable.  I suggested he stay through tomorrow, but he is insistent about being  discharged.  Wrist looks good.  Exam otherwise ok.  ECG with minor ST and T changes.    Long discussion with patient.  He does not want rehab.  Not sure if he can stop smoking.  I reiterated his need to take Brilinta, and also discussed possible shortness of breath.  I told him no driving for two weeks at present, and he will need follow up.  I reviewed statins. Total dc time greater than thirty minutes of review.  Check enzymes before discharge.  t  Shawnie Pons 9:05 AM 11/27/2011    d

## 2011-11-28 ENCOUNTER — Telehealth: Payer: Self-pay | Admitting: *Deleted

## 2011-11-28 NOTE — Telephone Encounter (Signed)
Pt's wife left a message that the pharmacy will not have Brilinta until tomorrow. She wanted to know if we knew if any other pharmacy would have this.  Spoke with pt's wife who states she finally found a pharmacy who has this. She does not need anything further at this time.

## 2011-12-11 ENCOUNTER — Encounter: Payer: Self-pay | Admitting: Cardiology

## 2011-12-11 DIAGNOSIS — I2510 Atherosclerotic heart disease of native coronary artery without angina pectoris: Secondary | ICD-10-CM | POA: Insufficient documentation

## 2011-12-11 DIAGNOSIS — R9430 Abnormal result of cardiovascular function study, unspecified: Secondary | ICD-10-CM | POA: Insufficient documentation

## 2011-12-11 DIAGNOSIS — I499 Cardiac arrhythmia, unspecified: Secondary | ICD-10-CM | POA: Insufficient documentation

## 2011-12-13 ENCOUNTER — Ambulatory Visit (INDEPENDENT_AMBULATORY_CARE_PROVIDER_SITE_OTHER): Payer: BC Managed Care – PPO | Admitting: Cardiology

## 2011-12-13 ENCOUNTER — Encounter: Payer: Self-pay | Admitting: Cardiology

## 2011-12-13 DIAGNOSIS — F17200 Nicotine dependence, unspecified, uncomplicated: Secondary | ICD-10-CM

## 2011-12-13 DIAGNOSIS — I499 Cardiac arrhythmia, unspecified: Secondary | ICD-10-CM

## 2011-12-13 DIAGNOSIS — I2510 Atherosclerotic heart disease of native coronary artery without angina pectoris: Secondary | ICD-10-CM

## 2011-12-13 DIAGNOSIS — E785 Hyperlipidemia, unspecified: Secondary | ICD-10-CM

## 2011-12-13 DIAGNOSIS — Z720 Tobacco use: Secondary | ICD-10-CM

## 2011-12-13 NOTE — Assessment & Plan Note (Signed)
The patient had some nonsustained ventricular tachycardia in the hospital before he was revascularized. This has not returned. He has normal LV function. He does not need any further monitoring.

## 2011-12-13 NOTE — Assessment & Plan Note (Signed)
The patient is doing very well status post non-STEMI with intervention. He is not having any recurrent symptoms. He is getting ready to start cardiac rehabilitation. Most probably he will be allowed to return to work on December 31, 2011. I had a long discussion with him about taking his medications and following the protocol to lose weight and to not smoke.

## 2011-12-13 NOTE — Patient Instructions (Addendum)
   Cardiac Rehab - already been contacted by [facility] Follow up in  3 months - see above

## 2011-12-13 NOTE — Assessment & Plan Note (Signed)
He has cut down to 4 cigarettes per day. I counseled him at length to continue to cut down further and stop as soon as possible.

## 2011-12-13 NOTE — Assessment & Plan Note (Signed)
He is on appropriate medications for his lipids.

## 2011-12-13 NOTE — Progress Notes (Signed)
HPI The patient is here to followup care for his coronary disease and to see him post hospitalization. He and I are meeting for the first time today. He had been admitted to the cardiology service [place] with a non-STEMI on November 24, 2011. Fortunately he did very well. His ejection fraction was 55% at the time of the cath. He received PTCA and overlapping DES to the circumflex. There was residual moderate disease of the LAD circumflex and right that her to be treated medically. During the hospitalization he did have some nonsustained ventricular tachycardia. This occurred before he was revascularized and there were no other arrhythmias later.  As part of the evaluation today I have reviewed the hospital records extensively and updated this chart.  The patient's symptom was tightness in the neck and discomfort in both arms from the forearm to the fingers. He had never had this before. He is overweight. He does smoke. He has cut down from a pack per day to the range of 4 per day. He and I and his wife had a long discussion about both of them stopping smoking completely. He seems quite motivated. No Known Allergies  Current Outpatient Prescriptions  Medication Sig Dispense Refill  . aspirin EC 81 MG EC tablet Take 1 tablet (81 mg total) by mouth daily.      Marland Kitchen atorvastatin (LIPITOR) 40 MG tablet Take 1 tablet (40 mg total) by mouth at bedtime.  90 tablet  2  . metoprolol tartrate (LOPRESSOR) 25 MG tablet Take 1 tablet (25 mg total) by mouth 2 (two) times daily.  180 tablet  2  . nitroGLYCERIN (NITROSTAT) 0.4 MG SL tablet Place 1 tablet (0.4 mg total) under the tongue every 5 (five) minutes as needed for chest pain (Up to 3 doses).  25 tablet  3  . Ticagrelor (BRILINTA) 90 MG TABS tablet Take 1 tablet (90 mg total) by mouth 2 (two) times daily.  180 tablet  2    History   Social History  . Marital Status: Married    Spouse Name: N/A    Number of Children: N/A  . Years of Education: N/A    Occupational History  . Not on file.   Social History Main Topics  . Smoking status: Current Everyday Smoker -- 1.0 packs/day    Types: Cigarettes  . Smokeless tobacco: Not on file  . Alcohol Use: No  . Drug Use: No  . Sexually Active: Not on file   Other Topics Concern  . Not on file   Social History Narrative  . No narrative on file    No family history on file.  Past Medical History  Diagnosis Date  . Coronary artery disease     January, 2013, NSTEMI s/p PTCA/overlapping DES to Cx 11/26/11, residual mod CAD for med rx in LAD/LCx/RCA , normal EF 55% by cath at that time  . Dyslipidemia     11/2011: TChol 214, Trig 152, HDL 48, LDL 136 -- started on statin  . Ejection fraction     EF 55%, catheterization, January, 2013  . Arrhythmia     10 beats NSVT January, 2013 with non-STEMI resolves after intervention    Past Surgical History  Procedure Date  . Clavicle surgery   . Finger surgery     ROS  Patient denies fever, chills, headache, sweats, rash, change in vision, change in hearing, chest pain, cough, nausea vomiting, urinary symptoms. All other systems are reviewed and are negative.  PHYSICAL EXAM  The patient is here with his wife. He is oriented to person time and place. Affect is normal. He is overweight. Head is atraumatic. There is no jugular venous distention. Lungs are clear. Respiratory effort is nonlabored. Cardiac exam reveals S1 and S2. There no clicks or significant murmurs. The cath site in his left radial artery is completely healed. The abdomen is soft. There is no peripheral edema. There no musculoskeletal deformities. There are no skin rashes. Filed Vitals:   12/13/11 1400  BP: 125/78  Pulse: 64  Height: 6' (1.829 m)  Weight: 240 lb (108.863 kg)     ASSESSMENT & PLAN

## 2011-12-25 ENCOUNTER — Other Ambulatory Visit: Payer: Self-pay | Admitting: *Deleted

## 2011-12-25 MED ORDER — TICAGRELOR 90 MG PO TABS: 90.0000 mg | ORAL_TABLET | Freq: Two times a day (BID) | ORAL | Status: DC

## 2012-03-11 ENCOUNTER — Other Ambulatory Visit: Payer: Self-pay | Admitting: *Deleted

## 2012-03-11 MED ORDER — TICAGRELOR 90 MG PO TABS: 90.0000 mg | ORAL_TABLET | Freq: Two times a day (BID) | ORAL | Status: DC

## 2012-03-18 ENCOUNTER — Ambulatory Visit (INDEPENDENT_AMBULATORY_CARE_PROVIDER_SITE_OTHER): Payer: BC Managed Care – PPO | Admitting: Cardiology

## 2012-03-18 ENCOUNTER — Encounter: Payer: Self-pay | Admitting: Cardiology

## 2012-03-18 VITALS — BP 134/76 | HR 67 | Resp 16 | Ht 72.0 in | Wt 258.0 lb

## 2012-03-18 DIAGNOSIS — F17200 Nicotine dependence, unspecified, uncomplicated: Secondary | ICD-10-CM

## 2012-03-18 DIAGNOSIS — I2510 Atherosclerotic heart disease of native coronary artery without angina pectoris: Secondary | ICD-10-CM

## 2012-03-18 DIAGNOSIS — I499 Cardiac arrhythmia, unspecified: Secondary | ICD-10-CM

## 2012-03-18 DIAGNOSIS — Z720 Tobacco use: Secondary | ICD-10-CM

## 2012-03-18 NOTE — Assessment & Plan Note (Signed)
Patient is not having any marked palpitations. No further workup. I see him back in 6 months.

## 2012-03-18 NOTE — Progress Notes (Signed)
   HPI Patient is doing very well he has not had recurrent chest pain. He had a non-STEMI January, 2013. He received overlapping drug-eluting stents to the circumflex. There was moderate residual disease of the LAD and right. These are being treated medically.  No Known Allergies  Current Outpatient Prescriptions  Medication Sig Dispense Refill  . aspirin EC 81 MG EC tablet Take 1 tablet (81 mg total) by mouth daily.      Marland Kitchen atorvastatin (LIPITOR) 40 MG tablet Take 1 tablet (40 mg total) by mouth at bedtime.  90 tablet  2  . metoprolol tartrate (LOPRESSOR) 25 MG tablet Take 1 tablet (25 mg total) by mouth 2 (two) times daily.  180 tablet  2  . nitroGLYCERIN (NITROSTAT) 0.4 MG SL tablet Place 1 tablet (0.4 mg total) under the tongue every 5 (five) minutes as needed for chest pain (Up to 3 doses).  25 tablet  3  . Ticagrelor (BRILINTA) 90 MG TABS tablet Take 1 tablet (90 mg total) by mouth 2 (two) times daily.  180 tablet  3    History   Social History  . Marital Status: Married    Spouse Name: N/A    Number of Children: N/A  . Years of Education: N/A   Occupational History  . Not on file.   Social History Main Topics  . Smoking status: Current Everyday Smoker -- 1.0 packs/day    Types: Cigarettes  . Smokeless tobacco: Not on file  . Alcohol Use: No  . Drug Use: No  . Sexually Active: Not on file   Other Topics Concern  . Not on file   Social History Narrative  . No narrative on file    No family history on file.  Past Medical History  Diagnosis Date  . Coronary artery disease     January, 2013, NSTEMI s/p PTCA/overlapping DES to Cx 11/26/11, residual mod CAD for med rx in LAD/LCx/RCA , normal EF 55% by cath at that time  . Dyslipidemia     11/2011: TChol 214, Trig 152, HDL 48, LDL 136 -- started on statin  . Ejection fraction     EF 55%, catheterization, January, 2013  . Arrhythmia     10 beats NSVT January, 2013 with non-STEMI resolves after intervention  . Tobacco  abuse     Cutting down to 4 cigarettes per day    Past Surgical History  Procedure Date  . Clavicle surgery   . Finger surgery     ROS Patient denies fever, chills, headache, sweats, rash, change in vision, change in hearing, chest pain, cough, nausea vomiting, urinary symptoms. All other systems are reviewed and are negative.  PHYSICAL EXAM  Patient doing very well. He's oriented to person time and place. Affect is normal. There is no jugular venous distention. Lungs are clear. Respiratory effort is nonlabored. Cardiac exam reveals S1 and S2. There are no clicks or significant murmurs. The abdomen is soft. There is no peripheral edema.  Filed Vitals:   03/18/12 1312  BP: 134/76  Pulse: 67  Resp: 16  Height: 6' (1.829 m)  Weight: 258 lb (117.028 kg)     ASSESSMENT & PLAN

## 2012-03-18 NOTE — Assessment & Plan Note (Signed)
Patient is smoking 2 cigarettes a day. He continues to try to stop completely.

## 2012-03-18 NOTE — Assessment & Plan Note (Signed)
Coronary disease is stable. No change in therapy. 

## 2012-03-18 NOTE — Patient Instructions (Signed)
Your physician recommends that you schedule a follow-up appointment in:6 months. You will receive a letter in the mail 1-2 months in advance reminding you to call and schedule your appointment. If you don't receive this letter, please contact our office.  Your physician recommends that you continue on your current medications as directed. Please refer to the Current Medication list given to you today.

## 2012-09-26 ENCOUNTER — Other Ambulatory Visit: Payer: Self-pay | Admitting: Physician Assistant

## 2012-12-08 ENCOUNTER — Ambulatory Visit: Payer: BC Managed Care – PPO | Admitting: Cardiology

## 2012-12-12 ENCOUNTER — Ambulatory Visit: Payer: BC Managed Care – PPO | Admitting: Physician Assistant

## 2012-12-18 ENCOUNTER — Ambulatory Visit: Payer: BC Managed Care – PPO | Admitting: Physician Assistant

## 2013-01-01 ENCOUNTER — Ambulatory Visit (INDEPENDENT_AMBULATORY_CARE_PROVIDER_SITE_OTHER): Payer: BC Managed Care – PPO | Admitting: Physician Assistant

## 2013-01-01 ENCOUNTER — Encounter: Payer: Self-pay | Admitting: Physician Assistant

## 2013-01-01 VITALS — BP 144/79 | HR 78 | Ht 72.0 in | Wt 259.8 lb

## 2013-01-01 DIAGNOSIS — E785 Hyperlipidemia, unspecified: Secondary | ICD-10-CM

## 2013-01-01 MED ORDER — NITROGLYCERIN 0.4 MG SL SUBL: 0.4000 mg | SUBLINGUAL_TABLET | SUBLINGUAL | Status: DC | PRN

## 2013-01-01 MED ORDER — NITROGLYCERIN 0.4 MG SL SUBL: 0.4000 mg | SUBLINGUAL_TABLET | SUBLINGUAL | Status: AC | PRN

## 2013-01-01 NOTE — Assessment & Plan Note (Signed)
Smoking cessation strongly advised. Patient appears motivated to do so on his own, having been intolerant of Chantix in the past.

## 2013-01-01 NOTE — Patient Instructions (Addendum)
   Stop Brilinta Continue all other current medications. Nitroglycerin refill sent to pharmacy Your physician wants you to follow up in:  1 year.  You will receive a reminder letter in the mail one-two months in advance.  If you don't receive a letter, please call our office to schedule the follow up appointment

## 2013-01-01 NOTE — Progress Notes (Signed)
Primary Cardiologist: Jerral Bonito, MD   HPI: Patient presents for routine followup, last seen here in clinic in May 2013, by Dr. Myrtis Ser. No medication adjustments were recommended at that time.  Clinically, he denies any interim development of exertional CP. He has since started working the third shift at [employer], and has difficulty sleeping during the day. However, he states that this also was the case in the past, when he previously worked a third shift.  Regarding his history of tobacco smoking, he currently smokes 5-7 cigarettes a day, and some days not at all. He tried Chantix in the past, but to no avail.   12-lead EKG today, reviewed by me, indicates NSR 70 bpm; NSST changes  No Known Allergies  Current Outpatient Prescriptions  Medication Sig Dispense Refill  . aspirin 81 MG tablet Take 81 mg by mouth daily.      Marland Kitchen atorvastatin (LIPITOR) 40 MG tablet TAKE 1 TABLET BY MOUTH AT BEDTIME  90 tablet  2  . metoprolol tartrate (LOPRESSOR) 25 MG tablet TAKE 1 TABLET TWICE A DAY  180 tablet  2  . nitroGLYCERIN (NITROSTAT) 0.4 MG SL tablet Place 1 tablet (0.4 mg total) under the tongue every 5 (five) minutes as needed for chest pain (Up to 3 doses).  25 tablet  3   No current facility-administered medications for this visit.    Past Medical History  Diagnosis Date  . Coronary artery disease     January, 2013, NSTEMI s/p PTCA/overlapping DES to Cx 11/26/11, residual mod CAD for med rx in LAD/LCx/RCA , normal EF 55% by cath at that time  . Dyslipidemia     11/2011: TChol 214, Trig 152, HDL 48, LDL 136 -- started on statin  . Ejection fraction     EF 55%, catheterization, January, 2013  . Arrhythmia     10 beats NSVT January, 2013 with non-STEMI resolves after intervention  . Tobacco abuse     Cutting down to 4 cigarettes per day    Past Surgical History  Procedure Laterality Date  . Clavicle surgery    . Finger surgery      History   Social History  . Marital Status:  Married    Spouse Name: N/A    Number of Children: N/A  . Years of Education: N/A   Occupational History  . Not on file.   Social History Main Topics  . Smoking status: Current Every Day Smoker -- 1.00 packs/day    Types: Cigarettes  . Smokeless tobacco: Not on file  . Alcohol Use: No  . Drug Use: No  . Sexually Active: Not on file   Other Topics Concern  . Not on file   Social History Narrative  . No narrative on file    No family history on file.  ROS: no nausea, vomiting; no fever, chills; no melena, hematochezia; no claudication  PHYSICAL EXAM: BP 144/79  Pulse 78  Ht 6' (1.829 m)  Wt 259 lb 12.8 oz (117.845 kg)  BMI 35.23 kg/m2 GENERAL: 47 year old male, moderately obese; NAD HEENT: NCAT, PERRLA, EOMI; sclera clear; no xanthelasma NECK: palpable bilateral carotid pulses, no bruits; no JVD; no TM LUNGS: CTA bilaterally CARDIAC: RRR (S1, S2); no significant murmurs; no rubs or gallops ABDOMEN: soft,  protuberant  EXTREMETIES: no significant peripheral edema SKIN: warm/dry; no obvious rash/lesions MUSCULOSKELETAL: no joint deformity NEURO: no focal deficit; NL affect   EKG: reviewed and available in Electronic Records   ASSESSMENT & PLAN:  Coronary  artery disease Patient is now greater than one year out, status post PCI with DES. Therefore, will discontinue Brilinta and continue low-dose ASA indefinitely.  Tobacco abuse Smoking cessation strongly advised. Patient appears motivated to do so on his own, having been intolerant of Chantix in the past.  Dyslipidemia Followed by primary M.D. Recommend aggressive management with target LDL 70 or less, if feasible. Continue current dose statin, pending review of scheduled followup lipid profile.    Gene Meloney Feld, PAC

## 2013-01-01 NOTE — Assessment & Plan Note (Signed)
Followed by primary M.D. Recommend aggressive management with target LDL 70 or less, if feasible. Continue current dose statin, pending review of scheduled followup lipid profile.

## 2013-01-01 NOTE — Assessment & Plan Note (Signed)
Patient is now greater than one year out, status post PCI with DES. Therefore, will discontinue Brilinta and continue low-dose ASA indefinitely.

## 2013-07-12 IMAGING — CR DG CHEST 1V PORT
1 series · 1 of 1 positions shown · non-contrast
Comparison: 10/03/2011

CLINICAL DATA: Chest pain.

PORTABLE CHEST - 1 VIEW

[view not recorded]
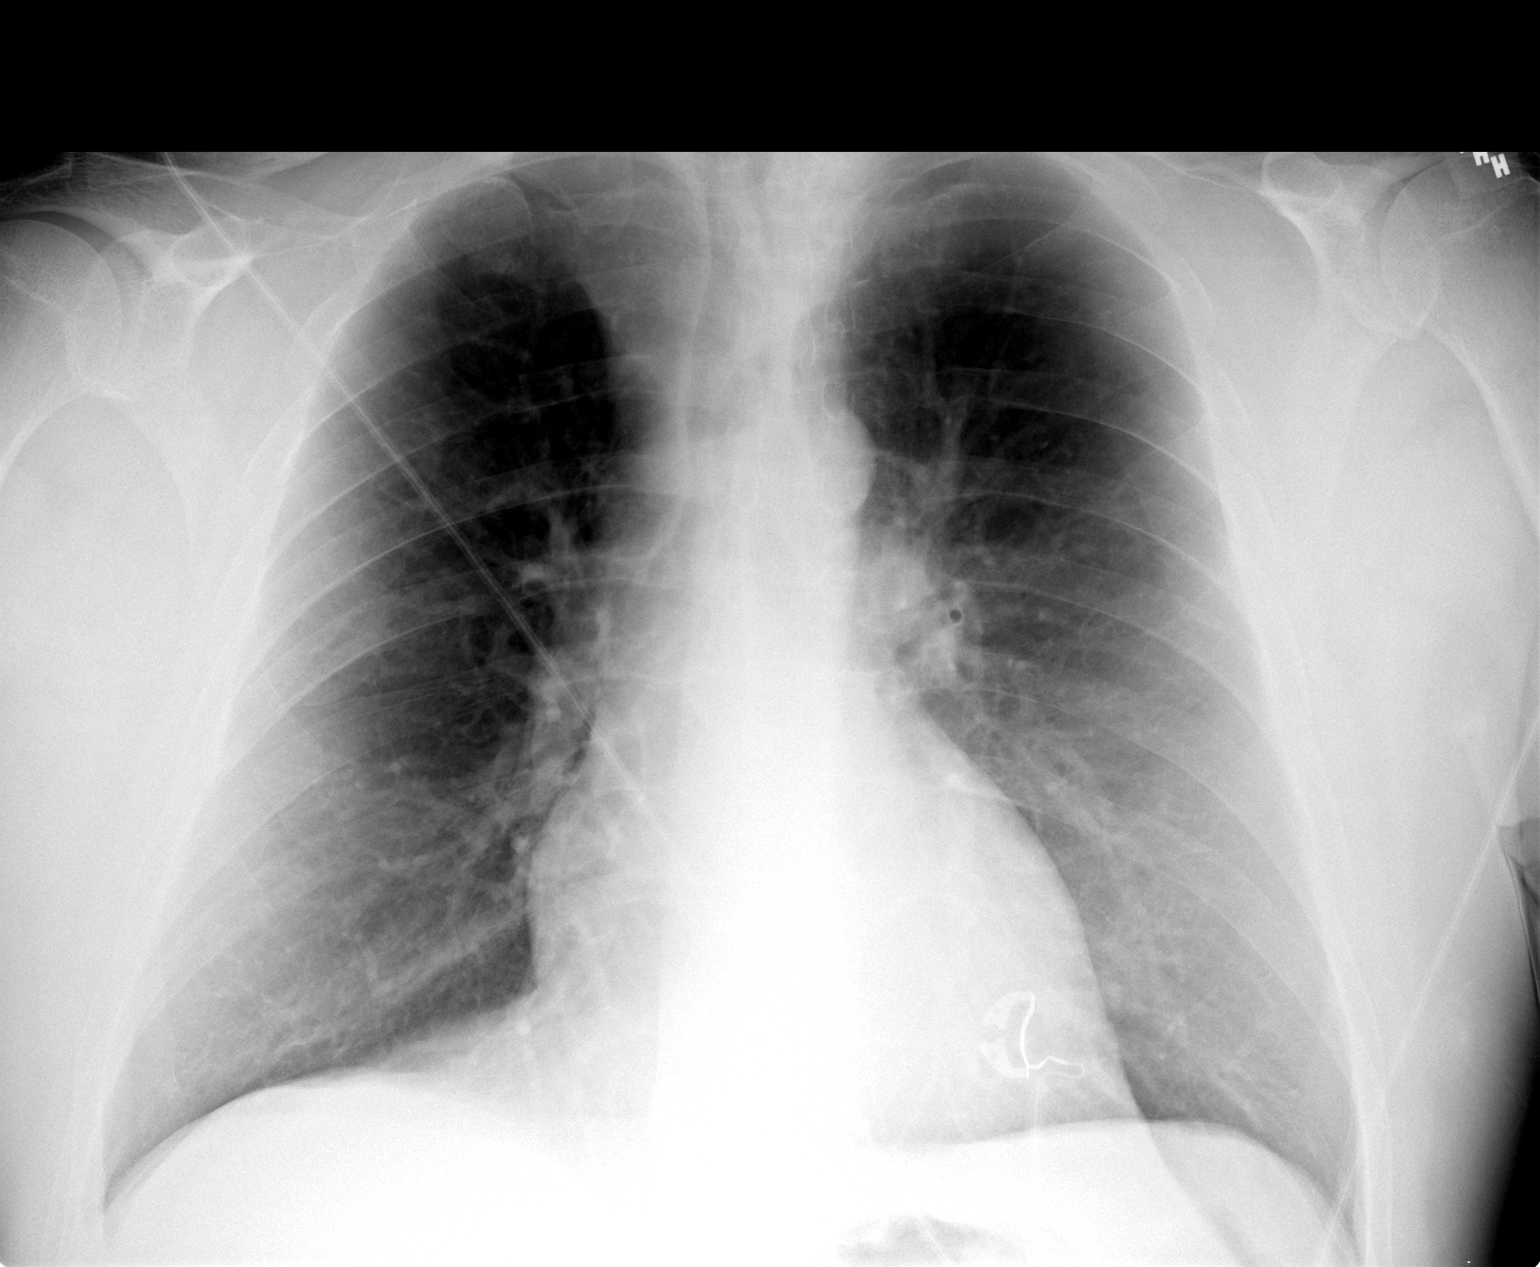

[1 of 1 positions shown; findings below may reference images not displayed]

FINDINGS: Heart and mediastinal contours are within normal limits.
No focal opacities or effusions.  No acute bony abnormality.
IMPRESSION: No active cardiopulmonary disease.

## 2013-07-29 ENCOUNTER — Other Ambulatory Visit: Payer: Self-pay | Admitting: Cardiology

## 2013-12-21 ENCOUNTER — Encounter: Payer: Self-pay | Admitting: Cardiology

## 2013-12-21 ENCOUNTER — Ambulatory Visit (INDEPENDENT_AMBULATORY_CARE_PROVIDER_SITE_OTHER): Payer: BC Managed Care – PPO | Admitting: Cardiology

## 2013-12-21 VITALS — BP 152/80 | HR 65 | Ht 72.0 in | Wt 246.0 lb

## 2013-12-21 DIAGNOSIS — I2510 Atherosclerotic heart disease of native coronary artery without angina pectoris: Secondary | ICD-10-CM

## 2013-12-21 DIAGNOSIS — E785 Hyperlipidemia, unspecified: Secondary | ICD-10-CM

## 2013-12-21 DIAGNOSIS — F17200 Nicotine dependence, unspecified, uncomplicated: Secondary | ICD-10-CM

## 2013-12-21 DIAGNOSIS — IMO0001 Reserved for inherently not codable concepts without codable children: Secondary | ICD-10-CM

## 2013-12-21 DIAGNOSIS — R030 Elevated blood-pressure reading, without diagnosis of hypertension: Secondary | ICD-10-CM

## 2013-12-21 DIAGNOSIS — Z720 Tobacco use: Secondary | ICD-10-CM

## 2013-12-21 DIAGNOSIS — I10 Essential (primary) hypertension: Secondary | ICD-10-CM | POA: Insufficient documentation

## 2013-12-21 NOTE — Progress Notes (Signed)
HPI  Patient returns for followup of coronary disease. I saw and last in 2013. He was seen in our office by Mr. Serpe in February, 2014. The patient had a non-STEMI in January, 2013. His symptom was marked nausea followed by some discomfort in his upper arms. He received overlapping drug-eluting stents to the circumflex. There was moderate residual disease of the LAD and RCA. This is been treated medically. He has not having any significant symptoms. His dual antiplatelet therapy was completed. He is on only aspirin. He is on a good dose of atorvastatin.  No Known Allergies  Current Outpatient Prescriptions  Medication Sig Dispense Refill  . aspirin 81 MG tablet Take 81 mg by mouth daily.      Marland Kitchen atorvastatin (LIPITOR) 40 MG tablet TAKE 1 TABLET BY MOUTH AT BEDTIME  90 tablet  2  . metoprolol tartrate (LOPRESSOR) 25 MG tablet TAKE 1 TABLET TWICE A DAY  180 tablet  2  . nitroGLYCERIN (NITROSTAT) 0.4 MG SL tablet Place 1 tablet (0.4 mg total) under the tongue every 5 (five) minutes as needed for chest pain (Up to 3 doses).  25 tablet  3  . Omega-3 Fatty Acids (FISH OIL) 1000 MG CAPS Take 1 capsule by mouth 2 (two) times daily.       No current facility-administered medications for this visit.    History   Social History  . Marital Status: Married    Spouse Name: N/A    Number of Children: N/A  . Years of Education: N/A   Occupational History  . Not on file.   Social History Main Topics  . Smoking status: Current Every Day Smoker -- 1.00 packs/day for 26 years    Types: Cigarettes    Start date: 10/28/1981  . Smokeless tobacco: Never Used  . Alcohol Use: No  . Drug Use: No  . Sexual Activity: Not on file   Other Topics Concern  . Not on file   Social History Narrative  . No narrative on file    No family history on file.  Past Medical History  Diagnosis Date  . Coronary artery disease     January, 2013, NSTEMI s/p PTCA/overlapping DES to Cx 11/26/11, residual mod CAD  for med rx in LAD/LCx/RCA , normal EF 55% by cath at that time  . Dyslipidemia     11/2011: TChol 214, Trig 152, HDL 48, LDL 136 -- started on statin  . Ejection fraction     EF 55%, catheterization, January, 2013  . Arrhythmia     10 beats NSVT January, 2013 with non-STEMI resolves after intervention  . Tobacco abuse     Cutting down to 4 cigarettes per day    Past Surgical History  Procedure Laterality Date  . Clavicle surgery    . Finger surgery      Patient Active Problem List   Diagnosis Date Noted  . Tobacco abuse   . Coronary artery disease   . Ejection fraction   . Arrhythmia   . Dyslipidemia     ROS   Patient denies fever, chills, headache, sweats, rash, change in vision, change in hearing, chest pain, cough, nausea vomiting, urinary symptoms. All other systems are reviewed and are negative.  PHYSICAL EXAM  Patient is oriented to person time and place. Affect is normal. There is no jugulovenous distention. Lungs are clear. Respiratory effort is nonlabored. Cardiac exam reveals S1 and S2. There no clicks or significant murmurs. He is overweight. Abdomen  is soft. There is no peripheral edema. There no musculoskeletal deformities. There are no skin rashes.  Filed Vitals:   12/21/13 1042  BP: 152/80  Pulse: 65  Height: 6' (1.829 m)  Weight: 246 lb (111.585 kg)  SpO2: 97%   EKG is done today and reviewed by me. There is normal sinus rhythm. There no significant abnormalities.  ASSESSMENT & PLAN

## 2013-12-21 NOTE — Patient Instructions (Signed)
Your physician recommends that you schedule a follow-up appointment in: 1 year. You will receive a reminder letter in the mail in about 10 months reminding you to call and schedule your appointment. If you don't receive this letter, please contact our office. Your physician recommends that you continue on your current medications as directed. Please refer to the Current Medication list given to you today. Your physician has requested that you regularly monitor and record your blood pressure readings at home. Please use the same machine at the same time of day to check your readings and record them. Stop smoking.

## 2013-12-21 NOTE — Assessment & Plan Note (Signed)
The patient is on guidelines directed statin therapy for his lipids.

## 2013-12-21 NOTE — Assessment & Plan Note (Signed)
Coronary disease is stable. He received stents to his circumflex in January, 2013. He does have residual disease of the right and LAD. He's not having any significant symptoms. He does not need any testing at this time. I reviewed with him all of the risk factors for coronary disease.

## 2013-12-21 NOTE — Assessment & Plan Note (Signed)
Unfortunately the patient continues to smoke. I discussed this with him and encouraged him strongly to try to begin to stop completely.

## 2013-12-21 NOTE — Assessment & Plan Note (Signed)
The patient's systolic pressure is elevated a little bit today. He says he's been under stress. He does have a blood pressure cuff at home. I've encouraged him to check his pressure and record it on a regular basis. He will bring the information to Dr. Sherryll BurgerShah. If his pressure runs above 140/85-90 he will be in touch with us or with Dr. Sherryll BurgerShah.  As part of today's evaluation I have spent greater than 25 minutes with his total care. More than half of this time has been with direct contact with him discussing his overall status including encouraging him to stop smoking and talking carefully about his blood pressure followup.

## 2014-06-17 ENCOUNTER — Other Ambulatory Visit: Payer: Self-pay | Admitting: Cardiology

## 2014-10-14 ENCOUNTER — Encounter (HOSPITAL_COMMUNITY): Payer: Self-pay | Admitting: Cardiology

## 2014-12-22 ENCOUNTER — Ambulatory Visit: Payer: Self-pay | Admitting: Cardiology

## 2015-02-09 ENCOUNTER — Ambulatory Visit (INDEPENDENT_AMBULATORY_CARE_PROVIDER_SITE_OTHER): Payer: BLUE CROSS/BLUE SHIELD | Admitting: Cardiology

## 2015-02-09 ENCOUNTER — Encounter: Payer: Self-pay | Admitting: Cardiology

## 2015-02-09 VITALS — BP 148/90 | HR 63 | Ht 72.0 in | Wt 254.0 lb

## 2015-02-09 DIAGNOSIS — I10 Essential (primary) hypertension: Secondary | ICD-10-CM

## 2015-02-09 DIAGNOSIS — Z720 Tobacco use: Secondary | ICD-10-CM

## 2015-02-09 DIAGNOSIS — I2510 Atherosclerotic heart disease of native coronary artery without angina pectoris: Secondary | ICD-10-CM

## 2015-02-09 DIAGNOSIS — E785 Hyperlipidemia, unspecified: Secondary | ICD-10-CM | POA: Diagnosis not present

## 2015-02-09 DIAGNOSIS — IMO0002 Reserved for concepts with insufficient information to code with codable children: Secondary | ICD-10-CM

## 2015-02-09 DIAGNOSIS — R0989 Other specified symptoms and signs involving the circulatory and respiratory systems: Secondary | ICD-10-CM | POA: Diagnosis not present

## 2015-02-09 DIAGNOSIS — R9430 Abnormal result of cardiovascular function study, unspecified: Secondary | ICD-10-CM

## 2015-02-09 NOTE — Patient Instructions (Signed)

## 2015-02-09 NOTE — Assessment & Plan Note (Signed)
His blood pressure is borderline high. This needs to be followed carefully with the possibility of adding medications. Currently his stress level was quite high and he prefers to not add any other medicines at this time.

## 2015-02-09 NOTE — Progress Notes (Signed)
Cardiology Office Note   Date:  02/09/2015   ID:  Jeremy Guzman, DOB 1966-08-17, MRN [id number]  PCP:  Kirstie PeriSHAH,ASHISH, MD  Cardiologist:  Willa RoughJeffrey Mikeyla Music, MD   Chief Complaint  Patient presents with  . Appointment    Follow-up coronary artery disease      History of Present Illness: Jeremy Guzman is a 49 y.o. male who presents today for 1 year follow-up of coronary disease. He has known disease. He received stents in January, 2013. He is on guideline directed therapy. He was seen by his primary team recently and labs were checked by them. 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 He is looking for other work. He is very stressed by this. He continues to smoke. He says that he is working on his diet.    Past Medical History  Diagnosis Date  . Coronary artery disease     January, 2013, NSTEMI s/p PTCA/overlapping DES to Cx 11/26/11, residual mod CAD for med rx in LAD/LCx/RCA , normal EF 55% by cath at that time  . Dyslipidemia     11/2011: TChol 214, Trig 152, HDL 48, LDL 136 -- started on statin  . Ejection fraction     EF 55%, catheterization, January, 2013  . Arrhythmia     10 beats NSVT January, 2013 with non-STEMI resolves after intervention  . Tobacco abuse     Cutting down to 4 cigarettes per day    Past Surgical History  Procedure Laterality Date  . Clavicle surgery    . Finger surgery    . Left heart catheterization with coronary angiogram N/A 11/26/2011    Procedure: LEFT HEART CATHETERIZATION WITH CORONARY ANGIOGRAM;  Surgeon: Herby Abrahamhomas D Stuckey, MD;  [facility];  Service: Cardiovascular;  Laterality: N/A;    Patient Active Problem List   Diagnosis Date Noted  . Elevated blood pressure 12/21/2013  . Tobacco abuse   . Coronary artery disease   . Ejection fraction   . Arrhythmia   . Dyslipidemia       Current Outpatient Prescriptions  Medication Sig Dispense Refill  . aspirin 81 MG tablet Take 81 mg by mouth daily.    Marland Kitchen. atorvastatin  (LIPITOR) 40 MG tablet TAKE 1 TABLET BY MOUTH AT BEDTIME 90 tablet 2  . metoprolol tartrate (LOPRESSOR) 25 MG tablet TAKE 1 TABLET TWICE A DAY 180 tablet 2  . nitroGLYCERIN (NITROSTAT) 0.4 MG SL tablet Place 1 tablet (0.4 mg total) under the tongue every 5 (five) minutes as needed for chest pain (Up to 3 doses). 25 tablet 3  . Omega-3 Fatty Acids (FISH OIL) 1000 MG CAPS Take 1 capsule by mouth 2 (two) times daily.     No current facility-administered medications for this visit.    Allergies:   Review of patient's allergies indicates no known allergies.    Social History:  The patient  reports that he has been smoking Cigarettes.  He started smoking about 33 years ago. He has a 26 pack-year smoking history. He has never used smokeless tobacco. He reports that he does not drink alcohol or use illicit drugs.   Family History:  The patient's family history includes Heart attack in his paternal uncle; Heart disease in his paternal uncle.    ROS:  Please see the history of present illness.     Patient denies fever, chills, headache, sweats, rash, change in vision, change in hearing, chest pain, cough, nausea or vomiting, urinary symptoms. All other systems are reviewed and  are negative.   PHYSICAL EXAM: VS:  BP 148/90 mmHg  Pulse 63  Ht 6' (1.829 m)  Wt 254 lb (115.214 kg)  BMI 34.44 kg/m2  SpO2 93% , Patient is oriented to person time and place. Affect is normal. Head is atraumatic. He is overweight. Sclera and conjunctiva are normal. There is no jugular venous distention. Lungs are clear. Respiratory effort is not labored. Cardiac exam reveals an S1 and S2. The abdomen is soft. There is no peripheral edema. There are no musculoskeletal deformities. There are no skin rashes.  EKG:   EKG is done today and reviewed by me. There are nonspecific ST changes. There is no significant change from the past. Recent Labs: No results found for requested labs within last 365 days.    Lipid Panel      Component Value Date/Time   CHOL 214* 11/25/2011 0520   TRIG 152* 11/25/2011 0520   HDL 48 11/25/2011 0520   CHOLHDL 4.5 11/25/2011 0520   VLDL 30 11/25/2011 0520   LDLCALC 136* 11/25/2011 0520      Wt Readings from Last 3 Encounters:  02/09/15 254 lb (115.214 kg)  12/21/13 246 lb (111.585 kg)  01/01/13 259 lb 12.8 oz (117.845 kg)      Current medicines are reviewed  The patient understands his medications.     ASSESSMENT AND PLAN:

## 2015-02-09 NOTE — Assessment & Plan Note (Signed)
The patient has coronary disease with overlapping stents to the circumflex. There is moderate other residual disease. He's not having any symptoms. I counseled him about his smoking. We talked about weight loss. We also talked about his blood pressure. Unfortunately all these things are affected by the current stress level with loss of his job. He is currently working overtime until the plant closes.

## 2015-02-09 NOTE — Assessment & Plan Note (Signed)
He is on guideline directed therapy. His labs are followed by his primary team. If his LDL is not 70 or lower, I would increase his atorvastatin to 80 mg. The next step after that would be to add Zetia.

## 2015-02-09 NOTE — Assessment & Plan Note (Signed)
The patient is not able to cut his smoking down any further with his current stress level. He says that he hopes to try to stop after he finds a new job.

## 2015-04-02 ENCOUNTER — Other Ambulatory Visit: Payer: Self-pay | Admitting: Cardiology

## 2015-04-05 NOTE — Telephone Encounter (Signed)
Per note 4.6.16 

## 2015-12-02 ENCOUNTER — Telehealth: Payer: Self-pay | Admitting: Cardiology

## 2015-12-02 NOTE — Telephone Encounter (Signed)
Per wife they have moved to another state
